# Patient Record
Sex: Male | Born: 1940 | Race: White | Hispanic: No | Marital: Married
Health system: Southern US, Community
[De-identification: ages and names within clinical notes are randomized; demographics above are authoritative.]

---

## 2017-02-26 ENCOUNTER — Other Ambulatory Visit: Payer: Self-pay | Admitting: Orthopaedic Surgery

## 2017-02-26 DIAGNOSIS — M4316 Spondylolisthesis, lumbar region: Secondary | ICD-10-CM

## 2017-03-08 ENCOUNTER — Other Ambulatory Visit: Payer: Self-pay

## 2017-03-12 ENCOUNTER — Ambulatory Visit
Admission: RE | Admit: 2017-03-12 | Discharge: 2017-03-12 | Disposition: A | Discharge status: Home / Self Care | Payer: Medicare Other | Source: Ambulatory Visit | Attending: Orthopaedic Surgery | Admitting: Orthopaedic Surgery

## 2017-03-12 DIAGNOSIS — M4316 Spondylolisthesis, lumbar region: Secondary | ICD-10-CM

## 2017-03-12 MED ORDER — MEPERIDINE HCL 100 MG/ML IJ SOLN
50.0000 mg | Freq: Once | INTRAMUSCULAR | Status: AC
  Administered 2017-03-12: 50 mg via INTRAMUSCULAR

## 2017-03-12 MED ORDER — DIAZEPAM 5 MG PO TABS: 5.0000 mg | ORAL_TABLET | Freq: Once | ORAL | Status: DC

## 2017-03-12 MED ORDER — ONDANSETRON HCL 4 MG/2ML IJ SOLN
4.0000 mg | Freq: Once | INTRAMUSCULAR | Status: AC
  Administered 2017-03-12: 4 mg via INTRAMUSCULAR

## 2017-03-12 MED ORDER — ONDANSETRON HCL 4 MG/2ML IJ SOLN: 4.0000 mg | Freq: Four times a day (QID) | INTRAMUSCULAR | Status: DC | PRN

## 2017-03-12 MED ORDER — IOPAMIDOL (ISOVUE-M 200) INJECTION 41%
20.0000 mL | Freq: Once | INTRAMUSCULAR | Status: AC
  Administered 2017-03-12: 20 mL via INTRATHECAL

## 2017-03-12 NOTE — Progress Notes (Signed)
Patient states he has been off Eliquis for at least the past two days.  Aaron SievertJeanne Phillipa Morden, RN

## 2017-03-12 NOTE — Discharge Instructions (Signed)

## 2018-01-27 IMAGING — CT CT L SPINE W/ CM
1 of 6 series · 5 of 14 positions shown, 7 images · non-contrast
Comparison: Noncontrast lumbar spine CT 01/27/2017

CLINICAL DATA: Low back pain and posterior lower extremity pain
bilaterally.
TECHNIQUE: Contiguous axial images were obtained through the Lumbar spine after
the intrathecal infusion of infusion. Coronal and sagittal
reconstructions were obtained of the axial image sets.

[Series 2: l spine soft · axial · 0.32mm/px · z∈[-286,-118]mm · 5 of 84 slices shown, 7 images]
[im 14/84  soft-tissue]
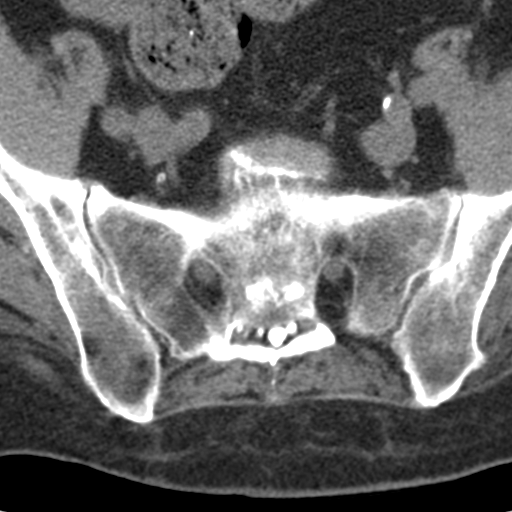
[im 14/84  bone]
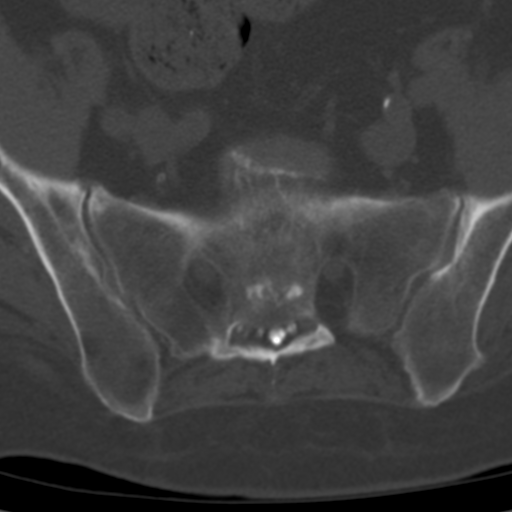
[im 28/84  bone]
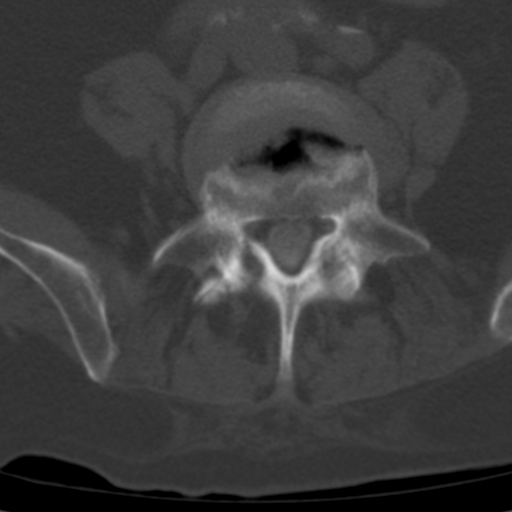
[im 42/84  bone]
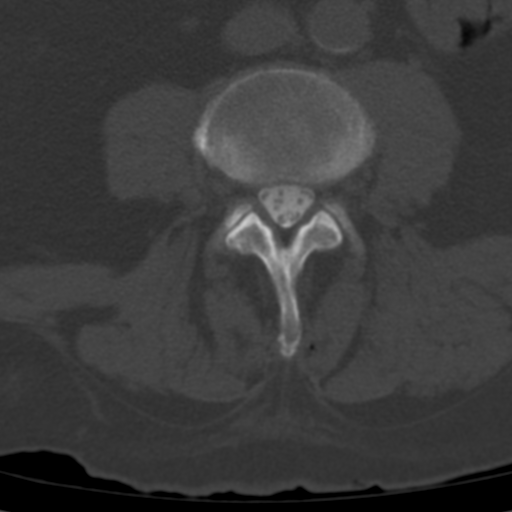
[im 56/84  bone]
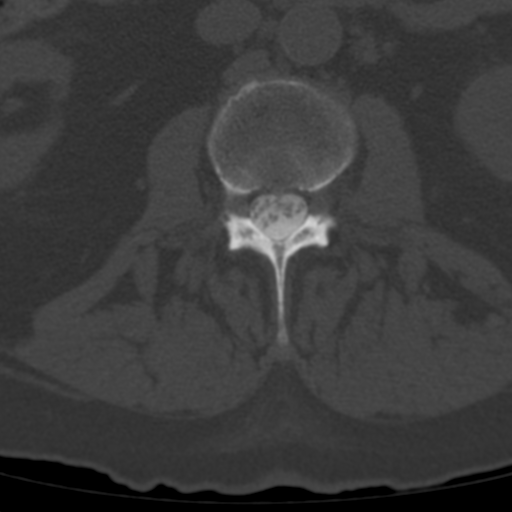
[im 70/84  soft-tissue]
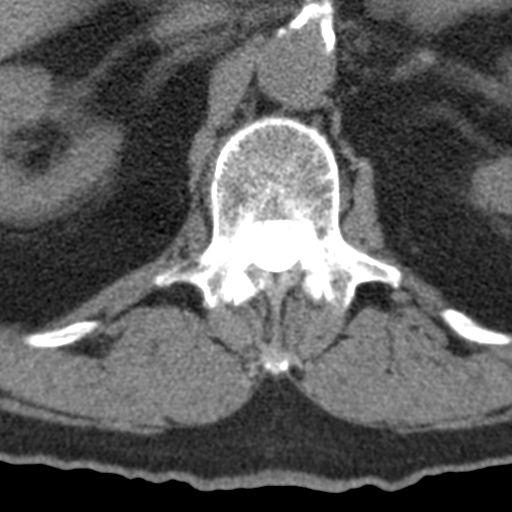
[im 70/84  bone]
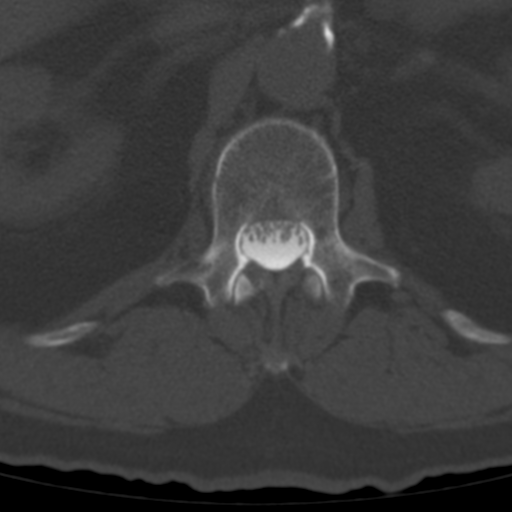

[5 of 14 positions shown; findings below may reference images not displayed]

EXAM:
LUMBAR MYELOGRAM

FLUOROSCOPY TIME:  Radiation Exposure Index (as provided by the
fluoroscopic device): 421.98 microGray*m^2

Fluoroscopy Time (in minutes and seconds):  24 seconds

PROCEDURE:
After thorough discussion of risks and benefits of the procedure
including bleeding, infection, injury to nerves, blood vessels,
adjacent structures as well as headache and CSF leak, written and
oral informed consent was obtained. Consent was obtained by Dr.
Labelle Salha Brack Tiger. Time out form was completed.

Patient was positioned prone on the fluoroscopy table. Local
anesthesia was provided with 1% lidocaine without epinephrine after
prepped and draped in the usual sterile fashion. Puncture was
performed at L3-4 using a 3 1/2 inch 22-gauge spinal needle via a
left interlaminar approach. Using a single pass through the dura,
the needle was placed within the thecal sac, with return of clear
CSF. 15 mL of Isovue M 200 was injected into the thecal sac, with
normal opacification of the nerve roots and cauda equina consistent
with free flow within the subarachnoid space.

I personally performed the lumbar puncture and administered the
intrathecal contrast. I also personally supervised acquisition of
the myelogram images.
FINDINGS: LUMBAR MYELOGRAM FINDINGS:

Lumbar segmentation is normal. There is grade 1 anterolisthesis of
L4 on L5 in the prone position. This increases to a grade 2
anterolisthesis with standing without significant additional change
during flexion or extension. There is severe spinal stenosis with
complete effacement of CSF in the thecal sac at L4-5 and with
effacement of the L5 nerve root sleeves. The thecal sac appears
adequately patent elsewhere.

CT LUMBAR MYELOGRAM FINDINGS:

On this supine CT, grade 1 anterolisthesis of L4 on L5 measures 6 mm
and is facet mediated. No fracture or destructive osseous lesion is
identified. Prominent vacuum disc phenomenon is present at L4-5 and
L5-S1. There is mild disc space narrowing at L4-5 with moderate to
severe narrowing at L5-S1. There is mild redundancy of the cauda
equina nerve roots in the mid upper lumbar spine due to the severe
spinal stenosis at L4-5. The conus medullaris terminates at T12-L1.
Aortic atherosclerosis is noted without aneurysm.

T12-L1 through L2-3: Normal discs. No stenosis. At most minimal
facet arthrosis.

L3-4: Minimal disc bulging and mild facet and ligamentum flavum
hypertrophy without stenosis.

L4-5: Listhesis with diffuse bulging of uncovered disc, ligamentum
flavum thickening, and advanced facet arthrosis result in severe
spinal stenosis and mild-to-moderate right greater than left neural
foraminal stenosis. Likely bilateral L5 nerve root impingement in
the lateral recesses.

L5-S1: Disc bulging, endplate spurring, and mild facet arthrosis
result in mild-to-moderate bilateral neural foraminal stenosis
without spinal or lateral recess stenosis.
IMPRESSION: 1. Advanced L4-5 facet arthrosis resulting in anterolisthesis
(increasing from grade 1 to grade 2 with standing) and severe spinal
stenosis.
2. Mild to moderate bilateral foraminal stenosis at L4-5 and L5-S1.
3.  Aortic Atherosclerosis ([id number]).
